# Patient Record
Sex: Male | Born: 1975 | Race: White | Hispanic: Yes | Marital: Married | Smoking: Never smoker
Health system: Southern US, Community
[De-identification: ages and names within clinical notes are randomized; demographics above are authoritative.]

---

## 2004-12-19 ENCOUNTER — Emergency Department (HOSPITAL_COMMUNITY): Admission: EM | Admit: 2004-12-19 | Discharge: 2004-12-19 | Payer: Self-pay | Admitting: Family Medicine

## 2005-11-01 ENCOUNTER — Inpatient Hospital Stay (HOSPITAL_COMMUNITY): Admission: EM | Admit: 2005-11-01 | Discharge: 2005-11-04 | Payer: Self-pay | Admitting: Emergency Medicine

## 2016-11-09 ENCOUNTER — Emergency Department (HOSPITAL_COMMUNITY): Payer: Self-pay

## 2016-11-09 ENCOUNTER — Encounter (HOSPITAL_COMMUNITY): Payer: Self-pay | Admitting: *Deleted

## 2016-11-09 ENCOUNTER — Emergency Department (HOSPITAL_COMMUNITY)
Admission: EM | Admit: 2016-11-09 | Discharge: 2016-11-09 | Disposition: A | Discharge status: Home / Self Care | Payer: Self-pay | Attending: Emergency Medicine | Admitting: Emergency Medicine

## 2016-11-09 DIAGNOSIS — D72829 Elevated white blood cell count, unspecified: Secondary | ICD-10-CM | POA: Insufficient documentation

## 2016-11-09 DIAGNOSIS — J039 Acute tonsillitis, unspecified: Secondary | ICD-10-CM | POA: Insufficient documentation

## 2016-11-09 DIAGNOSIS — R112 Nausea with vomiting, unspecified: Secondary | ICD-10-CM | POA: Insufficient documentation

## 2016-11-09 DIAGNOSIS — Z7289 Other problems related to lifestyle: Secondary | ICD-10-CM

## 2016-11-09 DIAGNOSIS — R509 Fever, unspecified: Secondary | ICD-10-CM

## 2016-11-09 DIAGNOSIS — F1010 Alcohol abuse, uncomplicated: Secondary | ICD-10-CM | POA: Insufficient documentation

## 2016-11-09 DIAGNOSIS — Z789 Other specified health status: Secondary | ICD-10-CM

## 2016-11-09 DIAGNOSIS — D729 Disorder of white blood cells, unspecified: Secondary | ICD-10-CM

## 2016-11-09 LAB — COMPREHENSIVE METABOLIC PANEL
ALBUMIN: 4 g/dL (ref 3.5–5.0)
ALK PHOS: 72 U/L (ref 38–126)
ALT: 25 U/L (ref 17–63)
ANION GAP: 9 (ref 5–15)
AST: 27 U/L (ref 15–41)
BUN: 12 mg/dL (ref 6–20)
CALCIUM: 8.8 mg/dL — AB (ref 8.9–10.3)
CHLORIDE: 103 mmol/L (ref 101–111)
CO2: 22 mmol/L (ref 22–32)
Creatinine, Ser: 1.04 mg/dL (ref 0.61–1.24)
GFR calc non Af Amer: >60 mL/min (ref >60)
Glucose, Bld: 130 mg/dL — ABNORMAL HIGH (ref 65–99)
Potassium: 3.9 mmol/L (ref 3.5–5.1)
SODIUM: 134 mmol/L — AB (ref 135–145)
Total Bilirubin: 0.6 mg/dL (ref 0.3–1.2)
Total Protein: 7.4 g/dL (ref 6.5–8.1)

## 2016-11-09 LAB — URINALYSIS, ROUTINE W REFLEX MICROSCOPIC
BILIRUBIN URINE: NEGATIVE
GLUCOSE, UA: NEGATIVE mg/dL
HGB URINE DIPSTICK: NEGATIVE
Ketones, ur: NEGATIVE mg/dL
Leukocytes, UA: NEGATIVE
Nitrite: NEGATIVE
PH: 8 (ref 5.0–8.0)
Protein, ur: NEGATIVE mg/dL
Specific Gravity, Urine: 1.02 (ref 1.005–1.030)

## 2016-11-09 LAB — DIFFERENTIAL
Basophils Absolute: 0 10*3/uL (ref 0.0–0.1)
Basophils Relative: 0 %
EOS PCT: 0 %
Eosinophils Absolute: 0 10*3/uL (ref 0.0–0.7)
LYMPHS ABS: 0.6 10*3/uL — AB (ref 0.7–4.0)
LYMPHS PCT: 4 %
MONO ABS: 0.5 10*3/uL (ref 0.1–1.0)
MONOS PCT: 4 %
NEUTROS ABS: 11.4 10*3/uL — AB (ref 1.7–7.7)
Neutrophils Relative %: 92 %

## 2016-11-09 LAB — CBC
HEMATOCRIT: 45.1 % (ref 39.0–52.0)
HEMOGLOBIN: 15.6 g/dL (ref 13.0–17.0)
MCH: 29.3 pg (ref 26.0–34.0)
MCHC: 34.6 g/dL (ref 30.0–36.0)
MCV: 84.8 fL (ref 78.0–100.0)
Platelets: 182 10*3/uL (ref 150–400)
RBC: 5.32 MIL/uL (ref 4.22–5.81)
RDW: 13.3 % (ref 11.5–15.5)
WBC: 12.9 10*3/uL — ABNORMAL HIGH (ref 4.0–10.5)

## 2016-11-09 LAB — LIPASE, BLOOD: LIPASE: 26 U/L (ref 11–51)

## 2016-11-09 LAB — I-STAT CG4 LACTIC ACID, ED: LACTIC ACID, VENOUS: 1.52 mmol/L (ref 0.5–1.9)

## 2016-11-09 LAB — RAPID STREP SCREEN (MED CTR MEBANE ONLY): STREPTOCOCCUS, GROUP A SCREEN (DIRECT): NEGATIVE

## 2016-11-09 MED ORDER — SODIUM CHLORIDE 0.9 % IV BOLUS (SEPSIS)
1000.0000 mL | Freq: Once | INTRAVENOUS | Status: AC
  Administered 2016-11-09: 1000 mL via INTRAVENOUS

## 2016-11-09 MED ORDER — ONDANSETRON HCL 4 MG/2ML IJ SOLN
4.0000 mg | Freq: Once | INTRAMUSCULAR | Status: AC
  Administered 2016-11-09: 4 mg via INTRAVENOUS
  Filled 2016-11-09: qty 2

## 2016-11-09 MED ORDER — GI COCKTAIL ~~LOC~~
30.0000 mL | Freq: Once | ORAL | Status: AC
  Administered 2016-11-09: 30 mL via ORAL
  Filled 2016-11-09: qty 30

## 2016-11-09 MED ORDER — ONDANSETRON HCL 8 MG PO TABS: 8.0000 mg | ORAL_TABLET | Freq: Three times a day (TID) | ORAL | 0 refills | Status: DC | PRN

## 2016-11-09 MED ORDER — ACETAMINOPHEN 325 MG PO TABS: 650.0000 mg | ORAL_TABLET | Freq: Once | ORAL | Status: DC

## 2016-11-09 MED ORDER — ACETAMINOPHEN 500 MG PO TABS
1000.0000 mg | ORAL_TABLET | Freq: Once | ORAL | Status: AC
  Administered 2016-11-09: 1000 mg via ORAL
  Filled 2016-11-09: qty 2

## 2016-11-09 MED ORDER — CEPHALEXIN 500 MG PO CAPS: 500.0000 mg | ORAL_CAPSULE | Freq: Two times a day (BID) | ORAL | 0 refills | Status: AC

## 2016-11-09 NOTE — ED Provider Notes (Signed)
[facility]Y DEPT Provider Note   CSN: [id number] Arrival date & time: 11/09/16  1112     History   Chief Complaint Chief Complaint  Patient presents with  . Fever  . Hematemesis    HPI Casey Welch is a 41 y.o. male with a PMHx of perirectal abscess s/p I&D in 10/2005 by Dr. Corliss Skains, who presents to the ED with complaints of fever, sore throat, body aches 2 days with 1 episode today of nausea/vomiting with scant streaks of red blood in the emesis. He states that on Friday he developed body aches fever and sore throat, states that he often gets sick with similar symptoms. He has tried 800 mg ibuprofen at 4 AM without any significant relief, no known reading factors. Prior to arrival today he had one episode of emesis that had a few streaks of red blood in the emesis, however was otherwise nonbilious and there was no frank large volume hematemesis. He does admit to drinking alcohol regularly, drinking approximately 3-4 beers per day. He denies any known sick contacts. He also denies any drooling, trismus, ear pain or drainage, rhinorrhea, cough, CP, SOB, abdominal pain, diarrhea, constipation, melena, hematochezia, dysuria, hematuria, numbness, tingling, focal weakness, or any other complaints at this time.   The history is provided by the patient, medical records and a relative. A language interpreter was used.  Fever   This is a new problem. The current episode started 2 days ago. The problem occurs daily. The problem has not changed since onset.The maximum temperature noted was 102 to 102.9 F. The temperature was taken using an oral thermometer. Associated symptoms include vomiting, sore throat and muscle aches. Pertinent negatives include no chest pain, no diarrhea and no cough. He has tried ibuprofen for the symptoms. The treatment provided no relief.    No past medical history on file.  There are no active problems to display for this patient.   No past surgical history on  file.     Home Medications    Prior to Admission medications   Not on File    Family History No family history on file.  Social History Social History  Substance Use Topics  . Smoking status: Never Smoker  . Smokeless tobacco: Never Used  . Alcohol use Yes     Comment: daily     Allergies   Patient has no known allergies.   Review of Systems Review of Systems  Constitutional: Positive for fever (102.5 Tmax).  HENT: Positive for sore throat. Negative for drooling, ear discharge, ear pain, rhinorrhea and trouble swallowing.   Respiratory: Negative for cough and shortness of breath.   Cardiovascular: Negative for chest pain.  Gastrointestinal: Positive for nausea and vomiting. Negative for abdominal pain, blood in stool, constipation and diarrhea.  Genitourinary: Negative for dysuria and hematuria.  Musculoskeletal: Positive for myalgias. Negative for arthralgias.  Skin: Negative for color change.  Allergic/Immunologic: Negative for immunocompromised state.  Neurological: Negative for weakness and numbness.  Psychiatric/Behavioral: Negative for confusion.   All other systems reviewed and are negative for acute change except as noted in the HPI.    Physical Exam Updated Vital Signs BP 133/79 (BP Location: Right Arm)   Pulse 96   Temp (!) 102.5 F (39.2 C) (Oral)   Resp 14   SpO2 96%    Physical Exam  Constitutional: He is oriented to person, place, and time. He appears well-developed and well-nourished.  Non-toxic appearance. No distress.  Febrile 102.5, but actually nontoxic appearing and  in NAD, doesn't appear septic  HENT:  Head: Normocephalic and atraumatic.  Nose: Nose normal.  Mouth/Throat: Uvula is midline and mucous membranes are normal. No trismus in the jaw. No uvula swelling. Oropharyngeal exudate, posterior oropharyngeal edema and posterior oropharyngeal erythema present. No tonsillar abscesses. Tonsils are 1+ on the right. Tonsils are 1+ on the  left. Tonsillar exudate.  Nose clear. Oropharynx injected, without uvular swelling or deviation, no trismus or drooling, 1+ bilateral tonsillar swelling and erythema, +exudates.  No PTA  Eyes: Conjunctivae and EOM are normal. Right eye exhibits no discharge. Left eye exhibits no discharge.  Neck: Normal range of motion. Neck supple.  No meningismus  Cardiovascular: Normal rate, regular rhythm, normal heart sounds and intact distal pulses.  Exam reveals no gallop and no friction rub.   No murmur heard. Pulmonary/Chest: Effort normal and breath sounds normal. No respiratory distress. He has no decreased breath sounds. He has no wheezes. He has no rhonchi. He has no rales.  CTAB in all lung fields, no w/r/r, no hypoxia or increased WOB, speaking in full sentences, SpO2 96% on RA   Abdominal: Soft. Normal appearance and bowel sounds are normal. He exhibits no distension. There is no tenderness. There is no rigidity, no rebound, no guarding, no CVA tenderness, no tenderness at McBurney's point and negative Murphy's sign.  Musculoskeletal: Normal range of motion.  Lymphadenopathy:       Head (right side): Tonsillar adenopathy present.       Head (left side): Tonsillar adenopathy present.  Tonsillar LAD bilaterally which is mildly TTP  Neurological: He is alert and oriented to person, place, and time. He has normal strength. No sensory deficit.  Skin: Skin is warm, dry and intact. No rash noted.  Psychiatric: He has a normal mood and affect.  Nursing note and vitals reviewed.    ED Treatments / Results  Labs (all labs ordered are listed, but only abnormal results are displayed) Labs Reviewed  COMPREHENSIVE METABOLIC PANEL - Abnormal; Notable for the following:       Result Value   Sodium 134 (*)    Glucose, Bld 130 (*)    Calcium 8.8 (*)    All other components within normal limits  CBC - Abnormal; Notable for the following:    WBC 12.9 (*)    All other components within normal limits   DIFFERENTIAL - Abnormal; Notable for the following:    Neutro Abs 11.4 (*)    Lymphs Abs 0.6 (*)    All other components within normal limits  RAPID STREP SCREEN (NOT AT [facility])  CULTURE, GROUP A STREP ([facility])  LIPASE, BLOOD  URINALYSIS, ROUTINE W REFLEX MICROSCOPIC  I-STAT CG4 LACTIC ACID, ED    EKG  EKG Interpretation None       Radiology Dg Chest 2 View  Result Date: 11/09/2016 CLINICAL DATA:  Sore throat fever and headache for 3 days. EXAM: CHEST  2 VIEW COMPARISON:  None. FINDINGS: Normal cardiac silhouette and mediastinal contours. No focal airspace opacities. No pleural effusion or pneumothorax. No evidence of edema. No acute osseus abnormalities. IMPRESSION: No acute cardiopulmonary disease. Specifically, no evidence of pneumonia. Electronically Signed   By: Simonne ComeJohn  Watts M.D.   On: 11/09/2016 12:46    Procedures Procedures (including critical care time)  Medications Ordered in ED Medications  ondansetron (ZOFRAN) injection 4 mg (4 mg Intravenous Given 11/09/16 1301)  sodium chloride 0.9 % bolus 1,000 mL (0 mLs Intravenous Stopped 11/09/16 1436)  gi cocktail (Maalox,Lidocaine,Donnatal) (30  mLs Oral Given 11/09/16 1302)  acetaminophen (TYLENOL) tablet 1,000 mg (1,000 mg Oral Given 11/09/16 1301)     Initial Impression / Assessment and Plan / ED Course  I have reviewed the triage vital signs and the nursing notes.  Pertinent labs & imaging results that were available during my care of the patient were reviewed by me and considered in my medical decision making (see chart for details).     41 y.o. male here with fever, sore throat, body aches, and an episode of n/v this morning with streaks of red blood in the emesis. Febrile on recheck here, 102.5, although no tachycardia or hypotension, does not appear septic; will hold off on calling code sepsis. On exam, throat injected with 1+ tonsillar swelling bilaterally and +exudates, +tonsillar LAD; pt denies cough, so CENTOR score  fairly high, will get RST. CBC showing mild leukocytosis 12.9, will add on differential but again will hold off on calling code sepsis or getting blood cx's/starting abx. Will give fluid bolus but not weight based given lack of hypotension. CMP, lipase, U/A, and lactic pending. Will get CXR and RST as well as the differential, and await the remainder of the work up. Will give GI cocktail, fluids, and zofran. Will also give tylenol for fever. Streaking hematemesis likely from oropharyngeal source, doubt true hematemesis; H/H stable. Doubt need for further work up of this. Will reassess shortly.   3:04 PM CMP fairly unremarkable, glucose mildly elevated 130, Na 134, doubt clinical significance at this time. Lipase WNL. Lactic WNL. Differential showing neutrophilic predominance. RST negative however CENTOR score fairly high, will empirically treat; although could still be viral (?flu, although outside of window where tamiflu would be helpful so will hold off on this). CXR unremarkable. U/A negative. Overall, source of fever is likely his throat; given high CENTOR score, will empirically treat for bacterial etiologies, although still could be viral. Hematemesis likely from oropharyngeal irritation, unlikely to actually be GI bleed. Pt feeling better and tolerating PO well. Will send home with rx for zofran. Advised alcohol cessation. Discussed tylenol/motrin and other OTC remedies for pain/symptom relief. F/up with [facility] in 1wk for recheck of symptoms and to establish medical care. I explained the diagnosis and have given explicit precautions to return to the ER including for any other new or worsening symptoms. The patient understands and accepts the medical plan as it's been dictated and I have answered their questions. Discharge instructions concerning home care and prescriptions have been given. The patient is STABLE and is discharged to home in good condition.    Final Clinical Impressions(s) / ED Diagnoses    Final diagnoses:  Tonsillitis  Fever, unspecified fever cause  Nausea and vomiting in adult patient  Neutrophilic leukocytosis  Alcohol use    New Prescriptions New Prescriptions   CEPHALEXIN (KEFLEX) 500 MG CAPSULE    Take 1 capsule (500 mg total) by mouth 2 (two) times daily.   ONDANSETRON (ZOFRAN) 8 MG TABLET    Take 1 tablet (8 mg total) by mouth every 8 (eight) hours as needed for nausea or vomiting.     [address], [place], [place] 11/09/16 1505    Pricilla Loveless, MD 11/17/16 1515

## 2016-11-09 NOTE — ED Triage Notes (Signed)
Pt reports having sore throat and fever since Friday. Had one episode of n/v this am and reports it was bright red blood. Reports drinking etoh daily.

## 2016-11-09 NOTE — Discharge Instructions (Signed)
Continue to stay well-hydrated. Use zofran as directed as needed for nausea. Gargle warm salt water and spit it out. Use chloraseptic spray as needed for sore throat. Continue to alternate between Tylenol and Ibuprofen for pain or fever. Use Mucinex for cough suppression/expectoration of mucus. Use netipot and flonase to help with nasal congestion. May consider over-the-counter Benadryl or other antihistamine to decrease secretions and for help with your symptoms. Take antibiotic as directed until completed, but keep in mind that your symptoms may still be viral and would just take time to resolve on their own. STOP DRINKING ALCOHOL! Follow up with the [facility] in 5-7 days for recheck of ongoing symptoms and to establish medical care. Return to emergency department for emergent changing or worsening of symptoms.

## 2016-11-11 LAB — CULTURE, GROUP A STREP (THRC)

## 2017-12-30 IMAGING — CR DG CHEST 2V
3 series · 3 of 3 positions shown · non-contrast
Comparison: None.

CLINICAL DATA: Sore throat fever and headache for 3 days.

EXAM:
CHEST  2 VIEW

[chest pa]
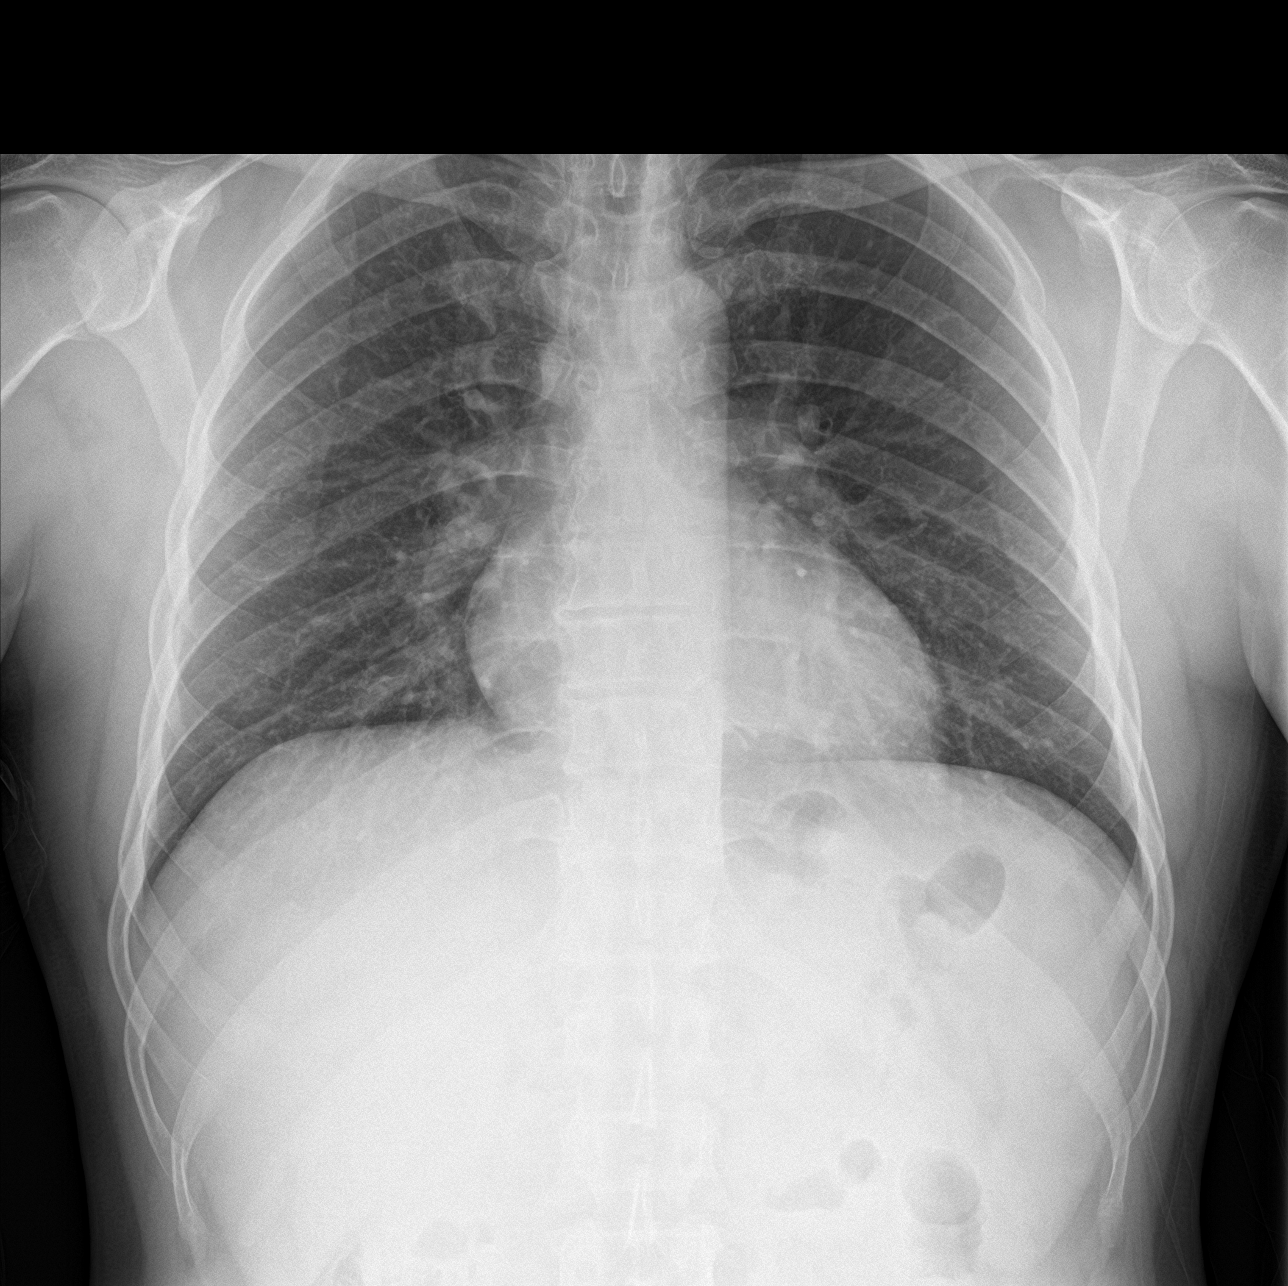

[chest lat (1 of 2)]
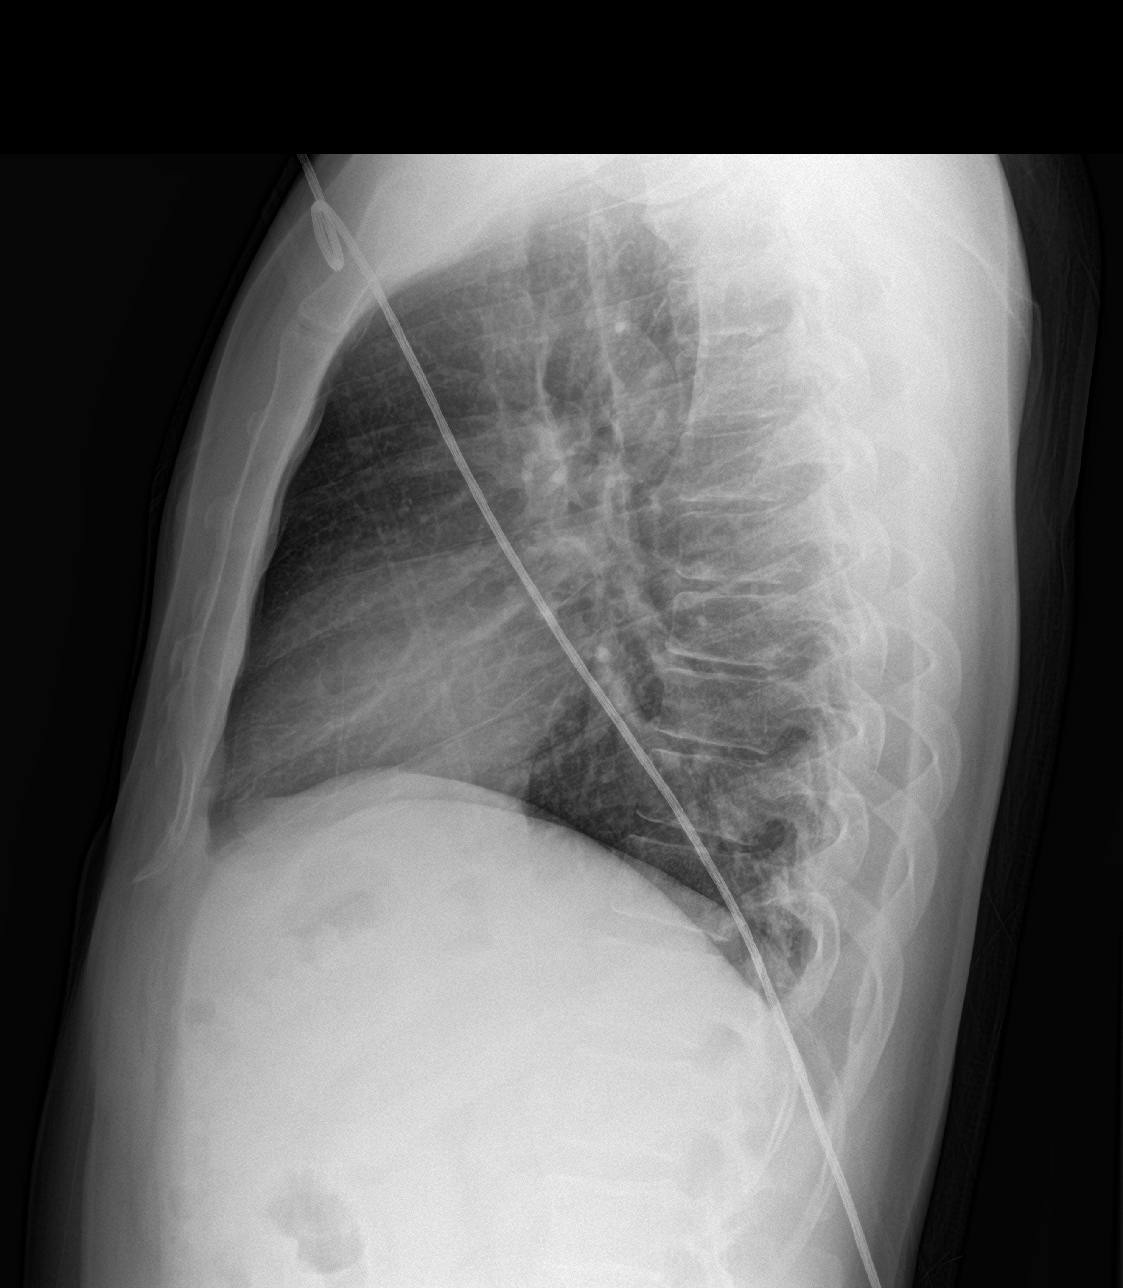

[chest lat (2 of 2)]
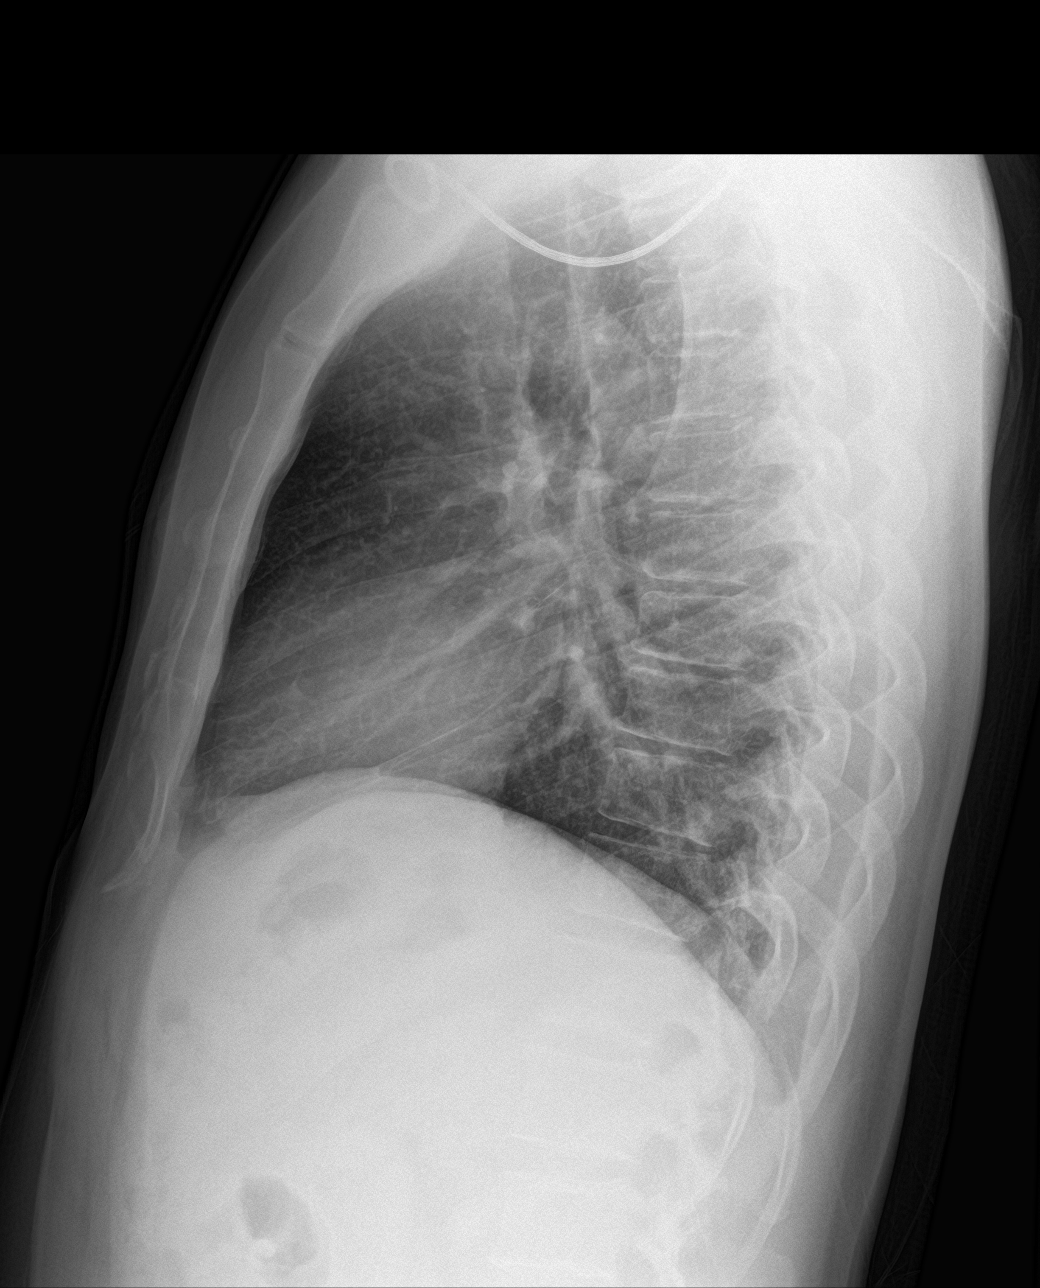

[3 of 3 positions shown; findings below may reference images not displayed]

FINDINGS: Normal cardiac silhouette and mediastinal contours. No focal
airspace opacities. No pleural effusion or pneumothorax. No evidence
of edema. No acute osseus abnormalities.
IMPRESSION: No acute cardiopulmonary disease. Specifically, no evidence of
pneumonia.

## 2020-09-17 ENCOUNTER — Encounter: Payer: Self-pay | Admitting: Emergency Medicine

## 2020-09-17 ENCOUNTER — Ambulatory Visit
Admission: EM | Admit: 2020-09-17 | Discharge: 2020-09-17 | Disposition: A | Discharge status: Home / Self Care | Payer: Self-pay | Attending: Emergency Medicine | Admitting: Emergency Medicine

## 2020-09-17 ENCOUNTER — Other Ambulatory Visit: Payer: Self-pay

## 2020-09-17 DIAGNOSIS — Z20822 Contact with and (suspected) exposure to covid-19: Secondary | ICD-10-CM

## 2020-09-17 DIAGNOSIS — J029 Acute pharyngitis, unspecified: Secondary | ICD-10-CM

## 2020-09-17 LAB — POCT RAPID STREP A (OFFICE): Rapid Strep A Screen: NEGATIVE

## 2020-09-17 MED ORDER — IBUPROFEN 600 MG PO TABS: 600.0000 mg | ORAL_TABLET | Freq: Four times a day (QID) | ORAL | 0 refills | Status: AC | PRN

## 2020-09-17 MED ORDER — FLUTICASONE PROPIONATE 50 MCG/ACT NA SUSP: 2.0000 | Freq: Every day | NASAL | 0 refills | Status: AC

## 2020-09-17 NOTE — Discharge Instructions (Addendum)
your rapid strep was negative today, so we have sent off a throat culture.  We will contact you and call in the appropriate antibiotics if your culture comes back positive for an infection requiring antibiotic treatment.  Give [place] a working phone number.  Your Covid and flu test will be back in several days.  Stay home and quarantine until you know the results of the test.  1 gram of Tylenol and 600 mg ibuprofen together 3-4 times a day as needed for pain.  Make sure you drink plenty of extra fluids.  Some people find salt water gargles and  Traditional Medicinal's "Throat Coat" tea helpful. Take 5 mL of liquid Benadryl and 5 mL of Maalox. Mix it together, and then hold it in your mouth for as long as you can and then swallow. You may do this 4 times a day.    Below is a list of primary care practices who are taking new patients for you to follow-up with.  [facility] internal medicine clinic Ground Floor - [facility], [address] [place], [place], [place] [address] [phone]  [facility] [address] [place], [place] [address] [phone]  [facility] [address] [place], [place] [address] [phone]  [facility] [phone] [address] [place] [address]  [facility]: [address] [place] [address]  [phone]  [facility]: [address] [place] [address]   [phone]  [facility]: [address] [place] [address]  [phone]  [facility] : [address] [place] [address] [phone]  [facility]: [address] [place] [address] [phone]  [facility]: [address] [place] [address] [phone]  Dr. Oneal Grout [address] [facility] [place] [address]  [phone]  Dr. Jackie Plum, [facility]. [address] [place], [place] [address]  [phone]  Go to www.goodrx.com to look up your medications. This will give you a list of where you can find your prescriptions at the most affordable prices. Or ask the pharmacist what the cash price is, or if they have any other discount programs available to help make your medication more affordable. This can be less expensive than what you would pay with insurance.

## 2020-09-17 NOTE — ED Triage Notes (Signed)
Pt is present with c/o fever, headache, sore throat, and earache.  Pt states that his fever was 104 yesterday and he took tylenol . Pt states that his sx started Saturday. Pt states that he took tylenol at 3 am this morning.

## 2020-09-17 NOTE — ED Provider Notes (Signed)
HPI  SUBJECTIVE:  Patient reports sore throat bilateral ear pain, neck pain starting yesterday.  No aggravating factors.  Sx better with Tylenol.  + Fever tmax 104   No neck stiffness  No Cough No nasal congestion, rhinorrhea, but reports postnasal drip No Myalgias No Headache No Rash  No loss of taste or smell No shortness of breath or difficulty breathing No nausea, vomiting No diarrhea No abdominal pain     No Recent, flu, COVID exposure No reflux sxs No Allergy sxs  No Breathing difficulty, voice changes, sensation of throat swelling shut No Drooling No Trismus No abx in past month.  Did not get the COVID or flu vaccine + Took Tylenol within 6 hours of evaluation. Patient has no past medical history PMD: None  History reviewed. No pertinent past medical history.  History reviewed. No pertinent surgical history.  History reviewed. No pertinent family history.  Social History   Tobacco Use  . Smoking status: Never Smoker  . Smokeless tobacco: Never Used  Substance Use Topics  . Alcohol use: Yes    Comment: daily  . Drug use: No    No current facility-administered medications for this encounter.  Current Outpatient Medications:  .  fluticasone (FLONASE) 50 MCG/ACT nasal spray, Place 2 sprays into both nostrils daily., Disp: 16 g, Rfl: 0 .  ibuprofen (ADVIL) 600 MG tablet, Take 1 tablet (600 mg total) by mouth every 6 (six) hours as needed., Disp: 30 tablet, Rfl: 0  No Known Allergies   ROS  As noted in HPI.   Physical Exam  BP 121/81 (BP Location: Left Arm)   Pulse (!) 109   Temp 98.8 F (37.1 C) (Oral)   Resp 18   SpO2 97%   Constitutional: Well developed, well nourished, no acute distress Eyes:  EOMI, conjunctiva normal bilaterally HENT: Normocephalic, atraumatic,mucus membranes moist.  No sinus tenderness.  TMs normal bilaterally. - nasal congestion + erythematous oropharynx +  enlarged tonsils - exudates. Uvula midline.  Positive  postnasal drip Respiratory: Normal inspiratory effort Cardiovascular: Regular tachycardia, no murmurs, rubs, gallops GI: nondistended, nontender. No appreciable splenomegaly skin: No rash, skin intact Lymph: + Anterior cervical LN.  No posterior cervical lymphadenopathy Musculoskeletal: no deformities Neurologic: Alert & oriented x 3, no focal neuro deficits Psychiatric: Speech and behavior appropriate.   ED Course   Medications - No data to display  Orders Placed This Encounter  Procedures  . Novel Coronavirus, NAA ([facility])    Standing Status:   Standing    Number of Occurrences:   1    Order Specific Question:   Release to patient    Answer:   Immediate  . Covid-19, Flu A+B ([facility])    Standing Status:   Standing    Number of Occurrences:   1  . POCT rapid strep A    Standing Status:   Standing    Number of Occurrences:   1    Results for orders placed or performed during the hospital encounter of 09/17/20 (from the past 24 hour(s))  POCT rapid strep A     Status: Normal   Collection Time: 09/17/20 10:55 AM  Result Value Ref Range   Rapid Strep A Screen Negative Negative   No results found.  ED Clinical Impression  1. Acute pharyngitis, unspecified etiology   2. Encounter for laboratory testing for COVID-19 virus      ED Assessment/Plan  Patient with a viral illness/pharyngitis.  Sending off COVID, flu.  Rapid strep negative.  Obtaining throat culture to guide antibiotic treatment. Discussed this with patient. We'll contact him if culture is positive, and will call in Appropriate antibiotics. Patient home with ibuprofen, Tylenol, Benadryl/Maalox mixture, Flonase for the postnasal drip.. Patient to followup with PMD of choice when necessary, will refer to local primary care resources in place in order for assistance in finding a PMD.  Patient declined work note.   Discussed labs,  MDM, plan and followup with patient. Discussed sn/sx that should prompt return to  the ED. patient agrees with plan.   Meds ordered this encounter  Medications  . ibuprofen (ADVIL) 600 MG tablet    Sig: Take 1 tablet (600 mg total) by mouth every 6 (six) hours as needed.    Dispense:  30 tablet    Refill:  0  . fluticasone (FLONASE) 50 MCG/ACT nasal spray    Sig: Place 2 sprays into both nostrils daily.    Dispense:  16 g    Refill:  0     *This clinic note was created using Scientist, clinical (histocompatibility and immunogenetics). Therefore, there may be occasional mistakes despite careful proofreading.     Domenick Gong, MD 09/18/20 [phone]

## 2020-09-19 LAB — COVID-19, FLU A+B NAA
Influenza A, NAA: NOT DETECTED
Influenza B, NAA: NOT DETECTED
SARS-CoV-2, NAA: NOT DETECTED
